# Patient Record
Sex: Male | Born: 1995 | Race: White | Hispanic: No | Marital: Single | Smoking: Never smoker
Health system: Southern US, Community
[De-identification: ages and names within clinical notes are randomized; demographics above are authoritative.]

## PROBLEM LIST (undated history)

## (undated) DIAGNOSIS — R519 Headache, unspecified: Secondary | ICD-10-CM

## (undated) DIAGNOSIS — R51 Headache: Secondary | ICD-10-CM

## (undated) DIAGNOSIS — J45909 Unspecified asthma, uncomplicated: Secondary | ICD-10-CM

## (undated) DIAGNOSIS — Z8489 Family history of other specified conditions: Secondary | ICD-10-CM

## (undated) DIAGNOSIS — J309 Allergic rhinitis, unspecified: Secondary | ICD-10-CM

## (undated) HISTORY — PX: OTHER SURGICAL HISTORY: SHX169

## (undated) HISTORY — DX: Allergic rhinitis, unspecified: J30.9

---
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
## 2017-06-14 ENCOUNTER — Other Ambulatory Visit: Payer: Self-pay | Admitting: Cardiothoracic Surgery

## 2017-06-14 DIAGNOSIS — Q676 Pectus excavatum: Secondary | ICD-10-CM

## 2017-06-15 ENCOUNTER — Encounter: Payer: Self-pay | Admitting: Cardiothoracic Surgery

## 2017-06-15 ENCOUNTER — Ambulatory Visit
Admission: RE | Admit: 2017-06-15 | Discharge: 2017-06-15 | Disposition: A | Discharge status: Home / Self Care | Payer: 59 | Source: Ambulatory Visit | Attending: Cardiothoracic Surgery | Admitting: Cardiothoracic Surgery

## 2017-06-15 ENCOUNTER — Ambulatory Visit: Payer: 59 | Admitting: Cardiothoracic Surgery

## 2017-06-15 VITALS — BP 110/74 | HR 52 | Resp 18 | Ht 71.0 in | Wt 117.0 lb

## 2017-06-15 DIAGNOSIS — Q676 Pectus excavatum: Secondary | ICD-10-CM

## 2017-06-15 NOTE — Progress Notes (Signed)
[address]       [place] [id number]             [phone]      Jeff MiresJoshua M Keller [facility] Medical Record #[id number] Date of Birth: 01-25-1996  Referring: Kendra Opitzole, Dawn Watkins, NP Primary Care: Kendra Opitzole, Dawn Watkins, NP  Chief Complaint:   POST OP FOLLOW UP 11/25/2015  OPERATIVE REPORT PREOPERATIVE DIAGNOSIS:  Pectus excavatum. POSTOPERATIVE DIAGNOSIS:  Pectus excavatum. PROCEDURE:  modified Ravitch repair of pectus excavatum with sternal plating. SURGEON:  Sheliah PlaneEdward Sura Canul, MD. FIRST ASSISTANT:  Armanda Magicaul Villarreal, [place].  History of Present Illness:     Patient comes in today in follow-up after his pectus repair done approximately 18 months ago.  Patient has no specific complaints denies any pain in his chest.     Past Medical History:  Diagnosis Date  . Allergic rhinitis   . Asthma    as a child  . Family history of adverse reaction to anesthesia    Moather - extreme nausea  . Headache    migraines- years ago- very rare now     Social History   Tobacco Use  Smoking Status Never Smoker  Smokeless Tobacco Never Used    Social History   Substance and Sexual Activity  Alcohol Use No  . Alcohol/week: 0.0 oz     No Known Allergies  Current Outpatient Medications  Medication Sig Dispense Refill  . ibuprofen (ADVIL,MOTRIN) 200 MG tablet Take 200 mg by mouth every 6 (six) hours as needed.     No current facility-administered medications for this visit.        Physical Exam: BP 110/74   Pulse (!) 52   Resp 18   Ht 5\' 11"  (1.803 m)   Wt 117 lb (53.1 kg)   SpO2 99% Comment: RA  BMI 16.32 kg/m   General appearance: alert, cooperative, appears stated age and no distress Head: Normocephalic, without obvious abnormality, atraumatic Neck: no adenopathy, no carotid bruit, no JVD, supple, symmetrical, trachea midline and thyroid not enlarged, symmetric, no tenderness/mass/nodules Lymph nodes: Cervical, supraclavicular, and axillary nodes  normal. Resp: clear to auscultation bilaterally Back: symmetric, no curvature. ROM normal. No CVA tenderness. Cardio: regular rate and rhythm, S1, S2 normal, no murmur, click, rub or gallop GI: soft, non-tender; bowel sounds normal; no masses,  no organomegaly Extremities: extremities normal, atraumatic, no cyanosis or edema and Homans sign is negative, no sign of DVT Neurologic: Grossly normal On examination of the incision pectus the patient has had recurrence of the pectus, on my view appears slightly worsened in 6 months ago though the patient does not think so.   Diagnostic Studies & Laboratory data:     Recent Radiology Findings:   Dg Chest 2 View  Result Date: 06/15/2017 CLINICAL DATA:  Pectus excavatum follow-up. EXAM: CHEST  2 VIEW COMPARISON:  Chest x-ray 12/15/2016 FINDINGS: The cardiac silhouette, mediastinal and hilar contours are normal and stable. The lungs are clear. Stable plate and screws on the sternum from a pectus repair. Stable moderate pectus deformity. IMPRESSION: No acute cardiopulmonary findings. Stable fixation hardware on the sternum. Electronically Signed   By: Rudie MeyerP.  Gallerani M.D.   On: 06/15/2017 11:49   I have independently reviewed the above radiology studies  and reviewed the findings with the patient.   Recent Lab Findings: Lab Results  Component Value Date   WBC 9.9 11/27/2015   HGB 12.2 (L) 11/27/2015   HCT 38.3 (L) 11/27/2015  PLT 123 (L) 11/27/2015   GLUCOSE 103 (H) 11/27/2015   ALT 11 (L) 11/27/2015   AST 20 11/27/2015   NA 137 11/27/2015   K 3.9 11/27/2015   CL 104 11/27/2015   CREATININE 0.95 11/27/2015   BUN <5 (L) 11/27/2015   CO2 27 11/27/2015   INR 1.20 11/23/2015      Assessment / Plan:   Sternal fixation is unchanged radiographically the patient has had some degree of recurrence of the pectus.  At this point he is not interested in any revision repair but is willing to continue to be followed, we will plan to see him back in 8  months with a follow-up chest x-ray.    Delight Ovens MD      [address] [place] [place] [address] Office [phone]   Beeper [phone]  06/15/2017 12:26 PM

## 2018-02-21 ENCOUNTER — Other Ambulatory Visit: Payer: Self-pay | Admitting: Cardiothoracic Surgery

## 2018-02-21 DIAGNOSIS — Q676 Pectus excavatum: Secondary | ICD-10-CM

## 2018-02-22 ENCOUNTER — Ambulatory Visit: Payer: 59 | Admitting: Cardiothoracic Surgery

## 2018-02-22 ENCOUNTER — Encounter: Payer: Self-pay | Admitting: Cardiothoracic Surgery

## 2018-02-22 ENCOUNTER — Ambulatory Visit
Admission: RE | Admit: 2018-02-22 | Discharge: 2018-02-22 | Disposition: A | Discharge status: Home / Self Care | Payer: 59 | Source: Ambulatory Visit | Attending: Cardiothoracic Surgery | Admitting: Cardiothoracic Surgery

## 2018-02-22 ENCOUNTER — Other Ambulatory Visit: Payer: Self-pay

## 2018-02-22 VITALS — BP 116/74 | HR 96 | Resp 18 | Ht 71.0 in | Wt 122.0 lb

## 2018-02-22 DIAGNOSIS — Q676 Pectus excavatum: Secondary | ICD-10-CM | POA: Diagnosis not present

## 2018-02-22 NOTE — Progress Notes (Signed)
[address]       [place] [address]             [phone]      KASTIN CERDA [facility] Medical Record #[id number] Date of Birth: [birth year]  Referring: Kendra Opitz, NP Primary Care: Kendra Opitz, NP  Chief Complaint:   POST OP FOLLOW UP 11/25/2015  OPERATIVE REPORT PREOPERATIVE DIAGNOSIS:  Pectus excavatum. POSTOPERATIVE DIAGNOSIS:  Pectus excavatum. PROCEDURE:  modified Ravitch repair of pectus excavatum with sternal plating. SURGEON:  Sheliah Plane, MD. FIRST ASSISTANT:  Armanda Magic, [place].  History of Present Illness:     Patient comes in today in follow-up after his pectus repair done approximately 24 months ago.  Patient has no specific complaints denies any pain in his chest.     Past Medical History:  Diagnosis Date  . Allergic rhinitis   . Asthma    as a child  . Family history of adverse reaction to anesthesia    Moather - extreme nausea  . Headache    migraines- years ago- very rare now     Social History   Tobacco Use  Smoking Status Never Smoker  Smokeless Tobacco Never Used    Social History   Substance and Sexual Activity  Alcohol Use No  . Alcohol/week: 0.0 standard drinks     No Known Allergies  Current Outpatient Medications  Medication Sig Dispense Refill  . ibuprofen (ADVIL,MOTRIN) 200 MG tablet Take 200 mg by mouth every 6 (six) hours as needed.     No current facility-administered medications for this visit.        Physical Exam: BP 116/74 (BP Location: Right Arm, Patient Position: Sitting, Cuff Size: Normal)   Pulse 96   Resp 18   Ht 5\' 11"  (1.803 m)   Wt 122 lb (55.3 kg)   SpO2 100% Comment: RA  BMI 17.02 kg/m  General appearance: alert, cooperative and no distress Head: Normocephalic, without obvious abnormality, atraumatic Neck: no adenopathy, no carotid bruit, no JVD, supple, symmetrical, trachea midline and thyroid not enlarged, symmetric, no  tenderness/mass/nodules Lymph nodes: Cervical, supraclavicular, and axillary nodes normal. Resp: clear to auscultation bilaterally Back: symmetric, no curvature. ROM normal. No CVA tenderness. Cardio: regular rate and rhythm, S1, S2 normal, no murmur, click, rub or gallop GI: soft, non-tender; bowel sounds normal; no masses,  no organomegaly Extremities: extremities normal, atraumatic, no cyanosis or edema and Homans sign is negative, no sign of DVT Neurologic: Grossly normal Patient has had regression of his repair, little change from when seen 8 months ago but definitely different from his initial six-month and one-year follow-up.   Diagnostic Studies & Laboratory data:     Recent Radiology Findings:   Dg Chest 2 View  Result Date: 02/22/2018 CLINICAL DATA:  Pectus excavatum. EXAM: CHEST - 2 VIEW COMPARISON:  06/15/2017. FINDINGS: Mediastinum hilar structures normal. Heart size normal. Lungs are clear. No pleural effusion or pneumothorax. Prior median sternotomy. Hardware intact and unchanged. Stable pectus deformity. No acute bony abnormality. IMPRESSION: 1. Stable prior median sternotomy. Hardware intact and in stable position. Stable pectus carinatum. 2.  No acute bony abnormality identified. Electronically Signed   By: Maisie Fus  Register   On: 02/22/2018 09:55   I have independently reviewed the above radiology studies  and reviewed the findings with the patient.   Recent Lab Findings: Lab Results  Component Value Date   WBC 9.9 11/27/2015   HGB 12.2 (L) 11/27/2015  HCT 38.3 (L) 11/27/2015   PLT 123 (L) 11/27/2015   GLUCOSE 103 (H) 11/27/2015   ALT 11 (L) 11/27/2015   AST 20 11/27/2015   NA 137 11/27/2015   K 3.9 11/27/2015   CL 104 11/27/2015   CREATININE 0.95 11/27/2015   BUN <5 (L) 11/27/2015   CO2 27 11/27/2015   INR 1.20 11/23/2015      Assessment / Plan:   Patient's hardware appears intact on x-ray, he has had progression of his repair with recurrent pectus.  I  discussed this with him at this point he is not interested in further repair but will return in 8 months for follow-up visit.   Delight Ovens MD      [address] [place] [place] [address] Office [phone]   Beeper [phone]  02/22/2018 10:14 AM

## 2018-03-23 IMAGING — CR DG CHEST 2V
2 series · 2 of 2 positions shown · non-contrast
Comparison: Radiographs 09/14/2015.  CT 10/08/2015.

CLINICAL DATA: Pre-op for pectus excavatum surgery. No current
complaints.

EXAM:
CHEST  2 VIEW

[w chest pa]
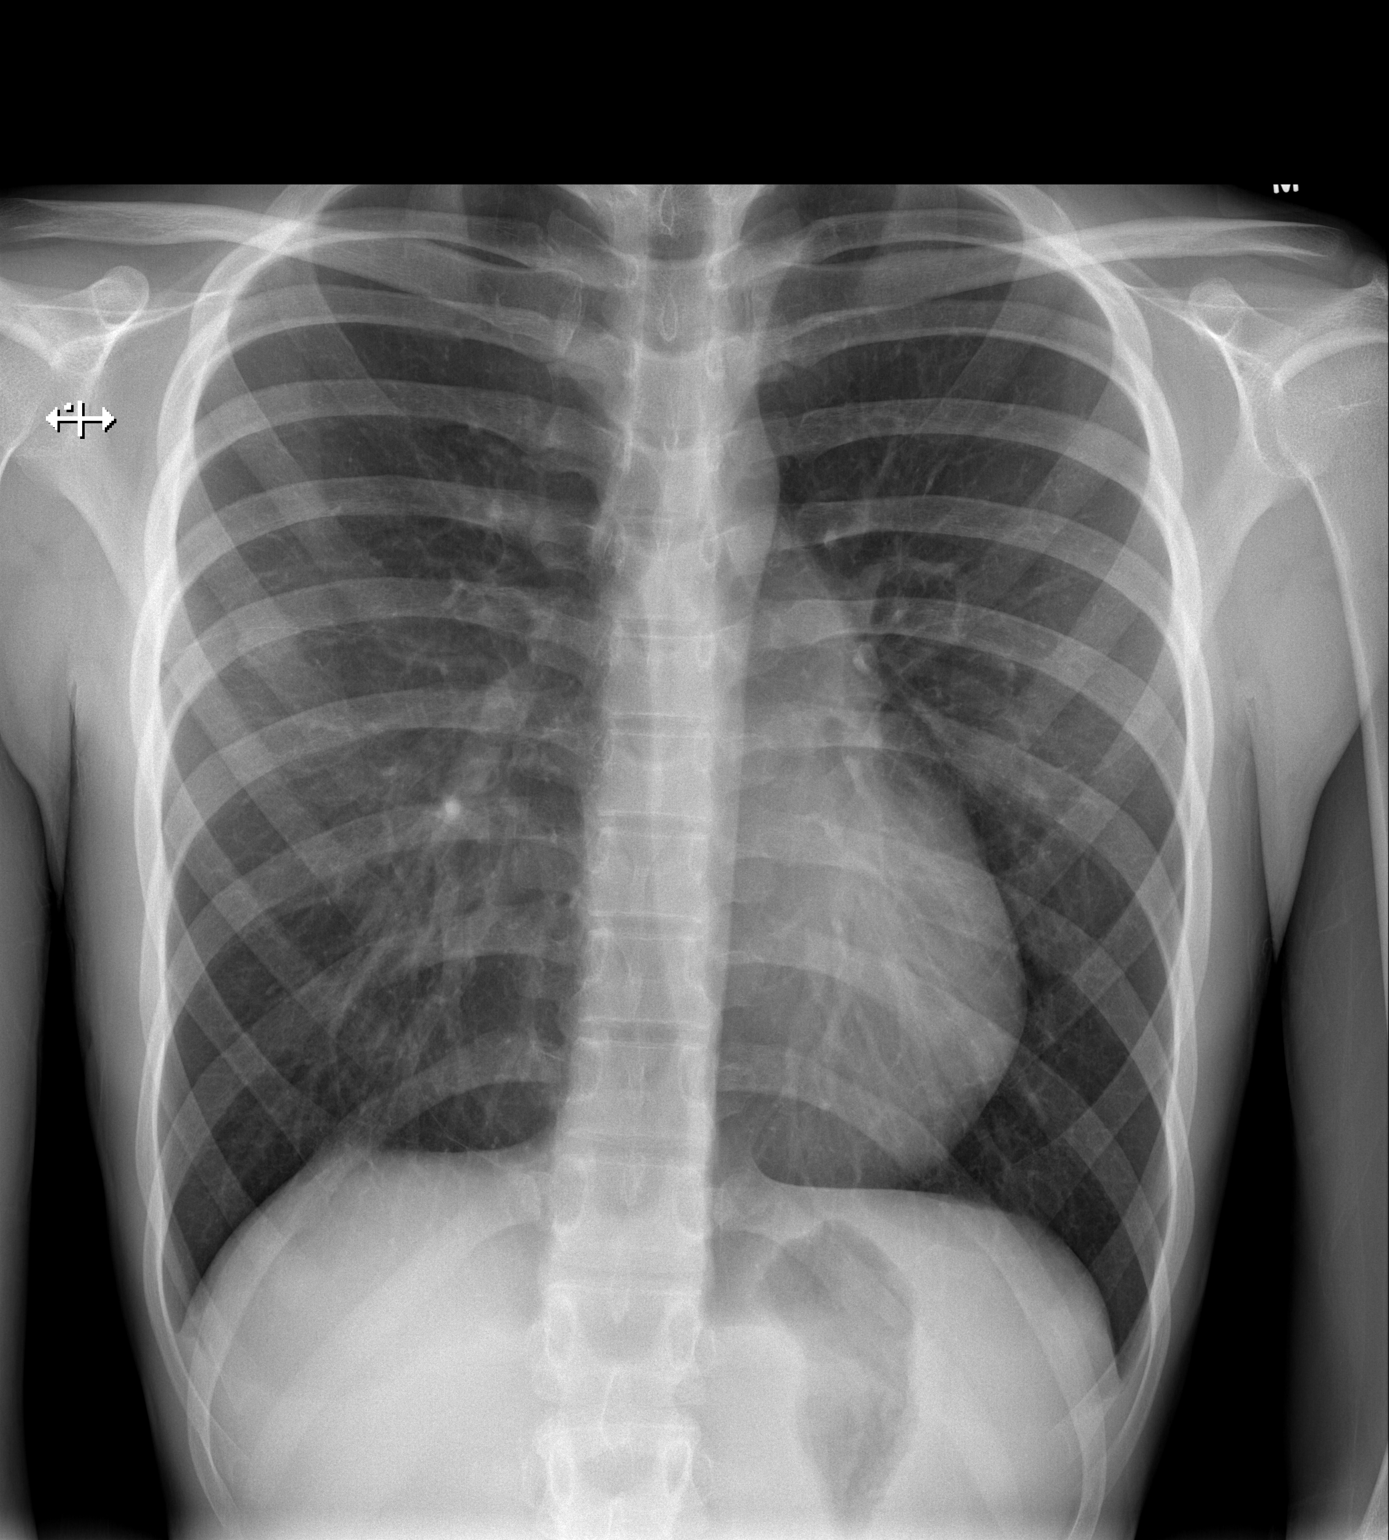

[w chest lat]
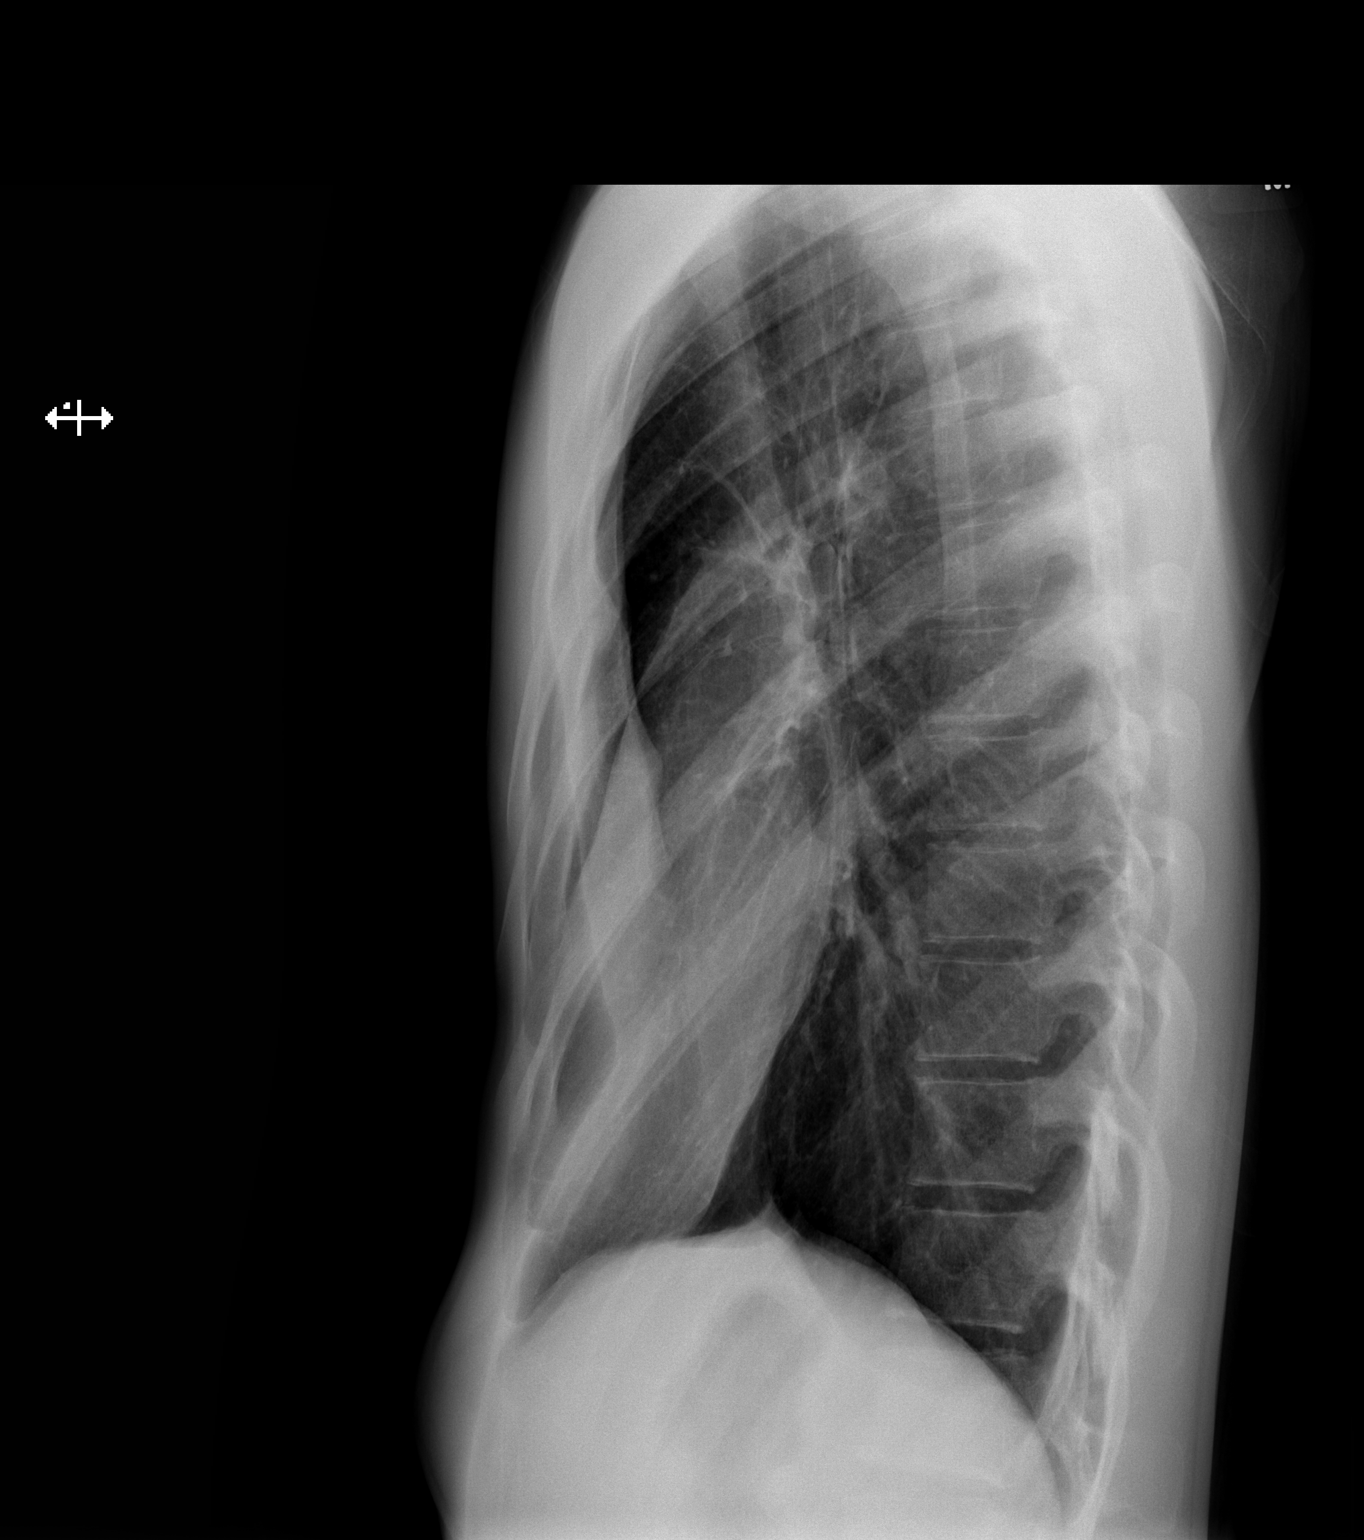

[2 of 2 positions shown; findings below may reference images not displayed]

FINDINGS: The heart size and mediastinal contours are stable. The lungs are
clear. There is no pleural effusion or pneumothorax. Marked pectus
excavatum deformity appears unchanged. Healing fracture of the right
fourth rib anteriorly is again noted. No acute osseous findings are
seen.
IMPRESSION: Stable chest with chronic pectus deformity.  No acute findings.

## 2018-03-25 IMAGING — CR DG CHEST 1V PORT
1 series · 1 of 1 positions shown · non-contrast
Comparison: 11/23/2015

CLINICAL DATA: Pectus excavatum repair

EXAM:
PORTABLE CHEST 1 VIEW

[AP]
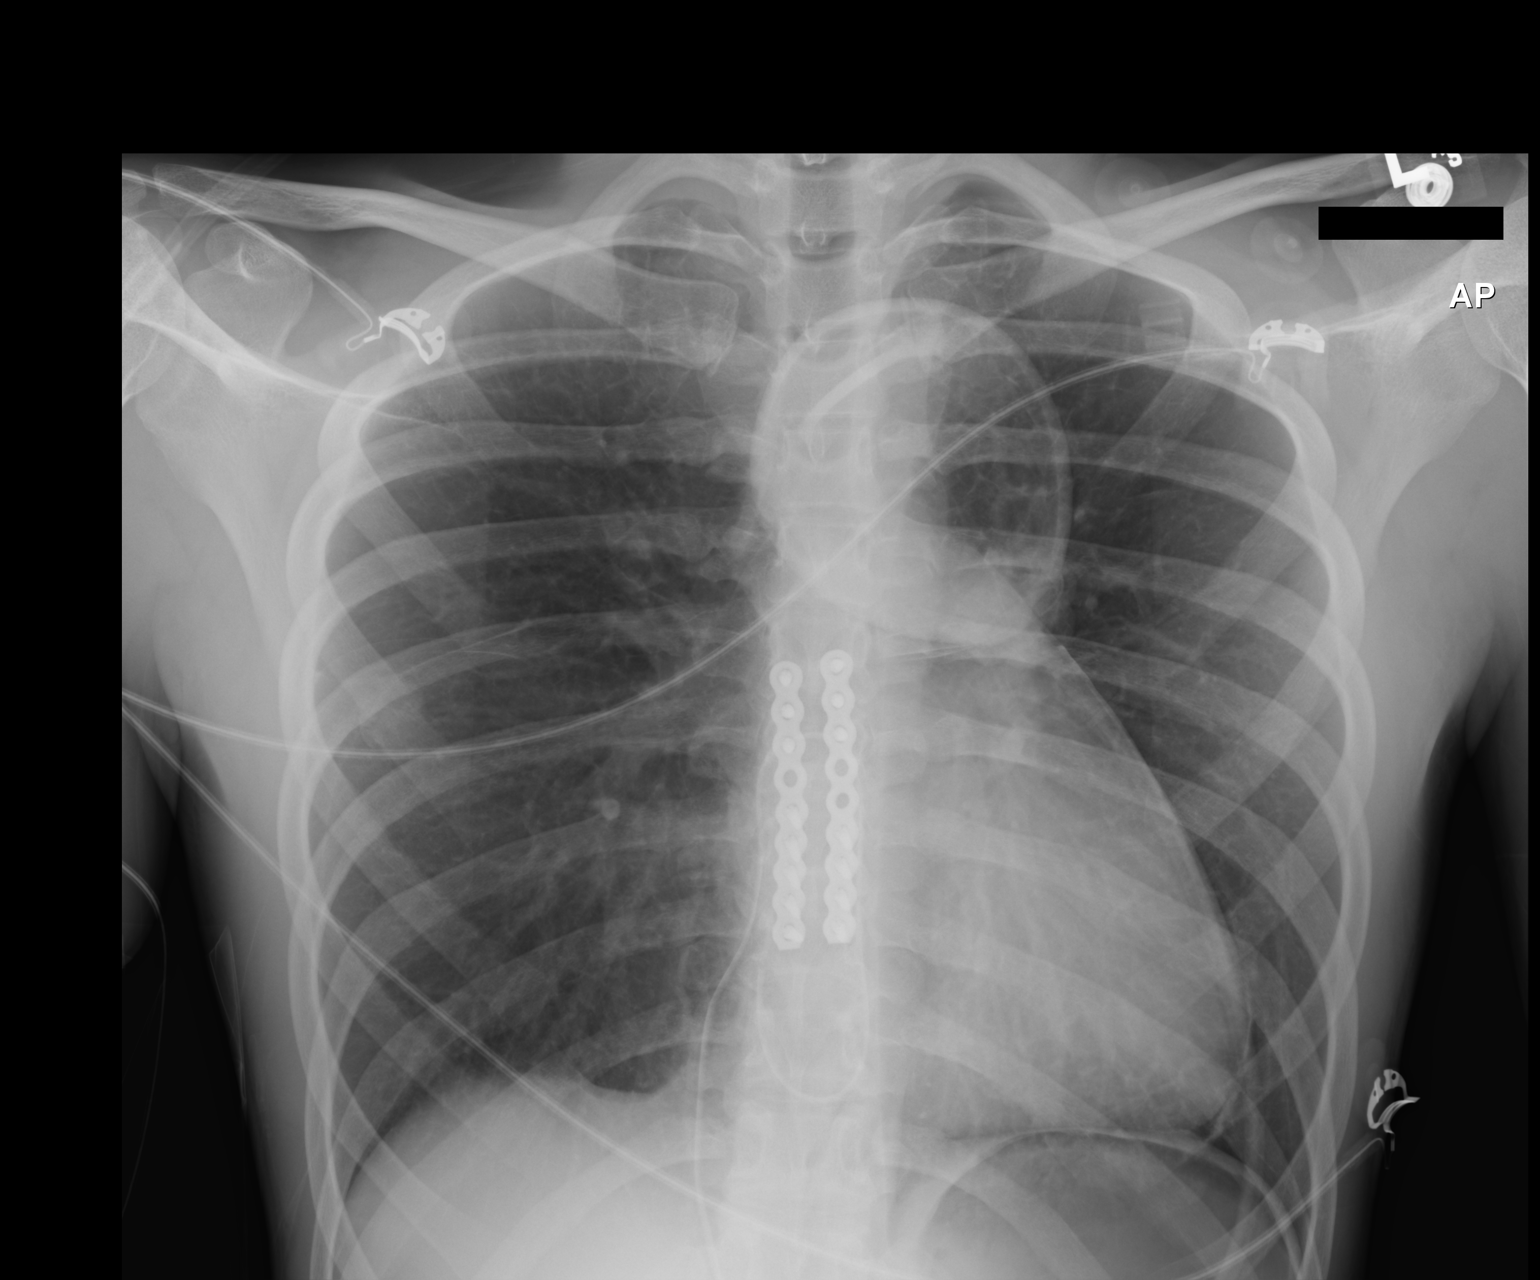

[1 of 1 positions shown; findings below may reference images not displayed]

FINDINGS: There is no focal parenchymal opacity. There is no pleural effusion
or pneumothorax. The heart and mediastinal contours are
unremarkable.

Interval pectus excavatum repair with 2 metallic sternal plates
present.
IMPRESSION: Interval pectus excavatum repair with 2 metallic sternal plates
present.

## 2018-03-27 IMAGING — DX DG CHEST 2V
2 series · 2 of 2 positions shown · non-contrast
Comparison: 11/26/2015

CLINICAL DATA: Sternal repair, postop day 2, chest soreness

EXAM:
CHEST  2 VIEW

[w chest pa]
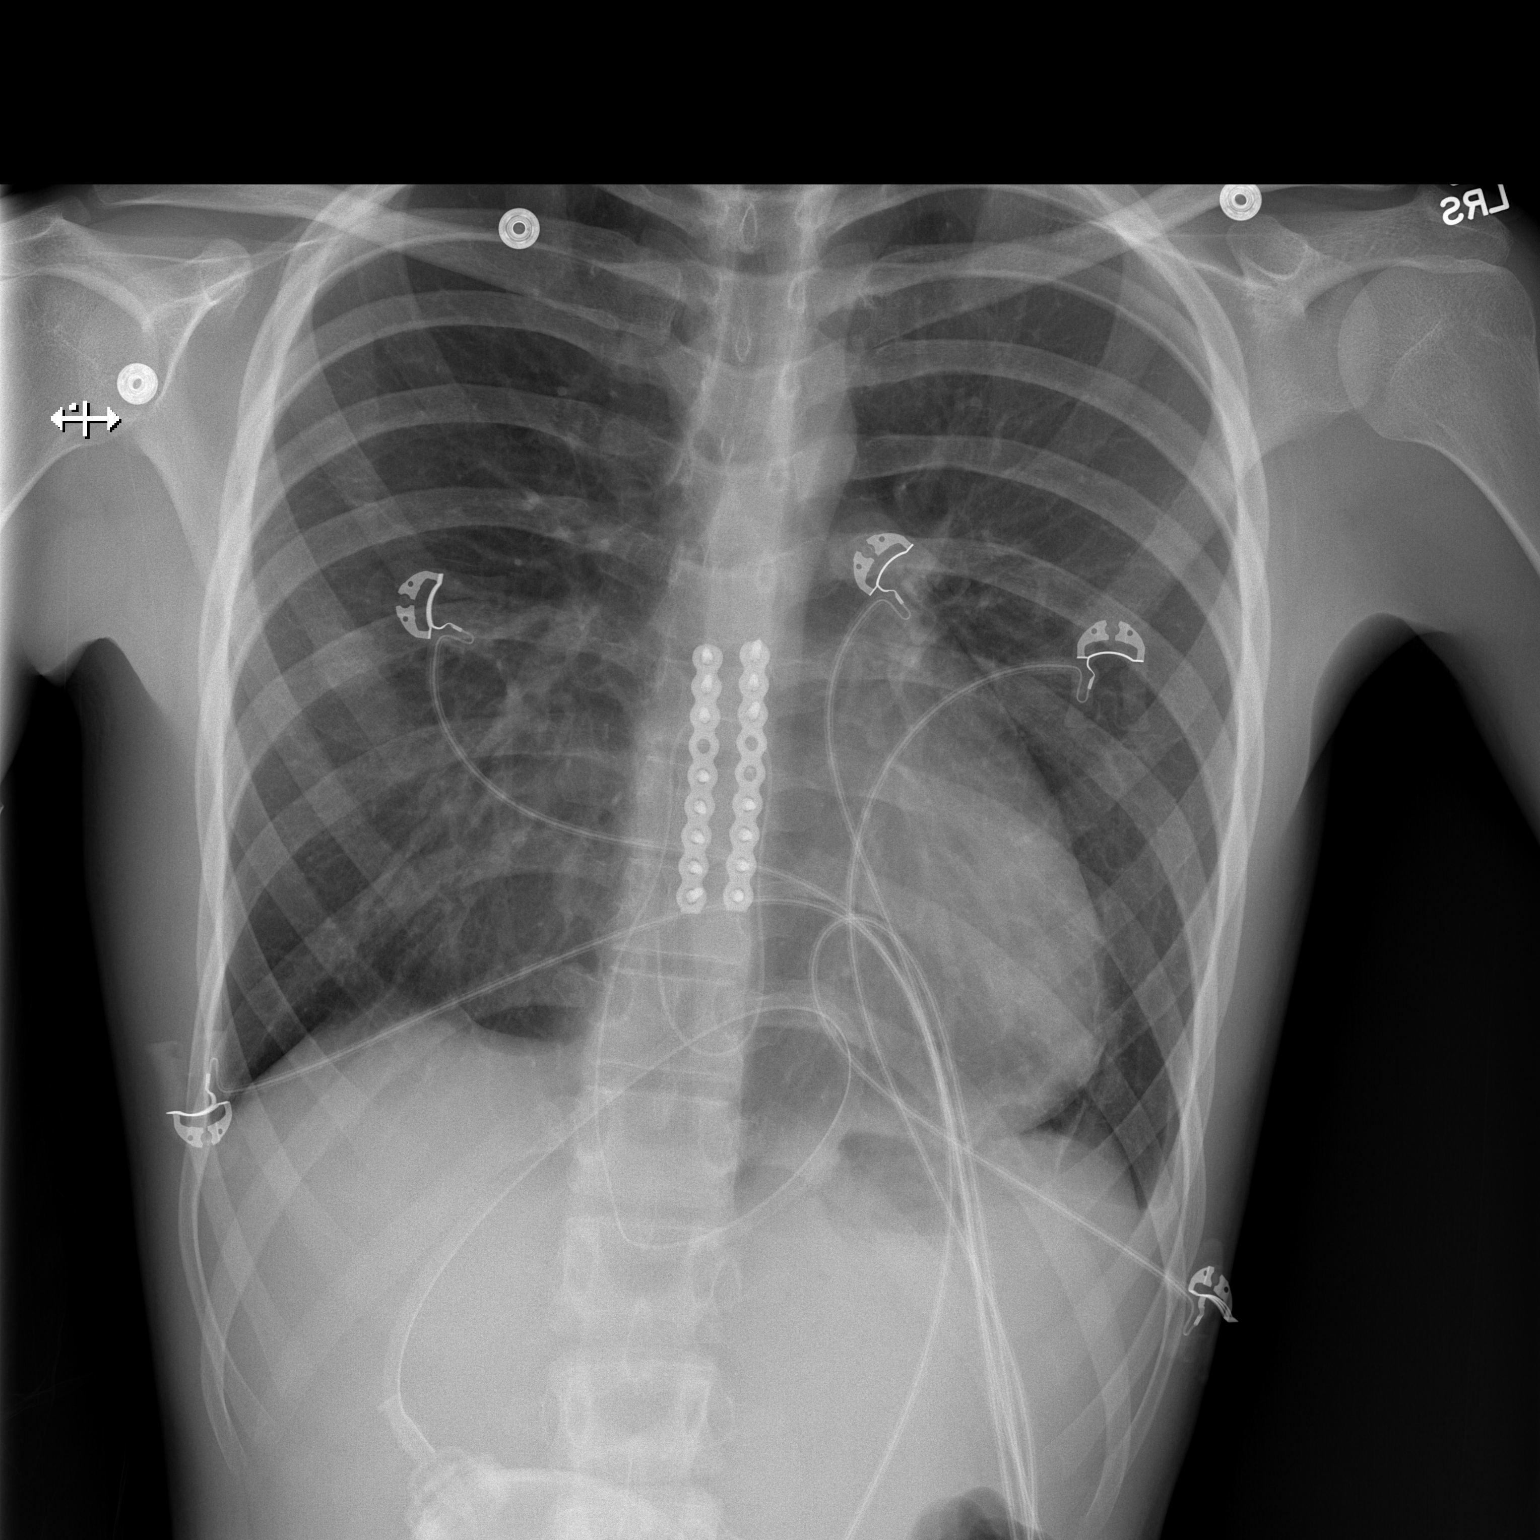

[w chest lat]
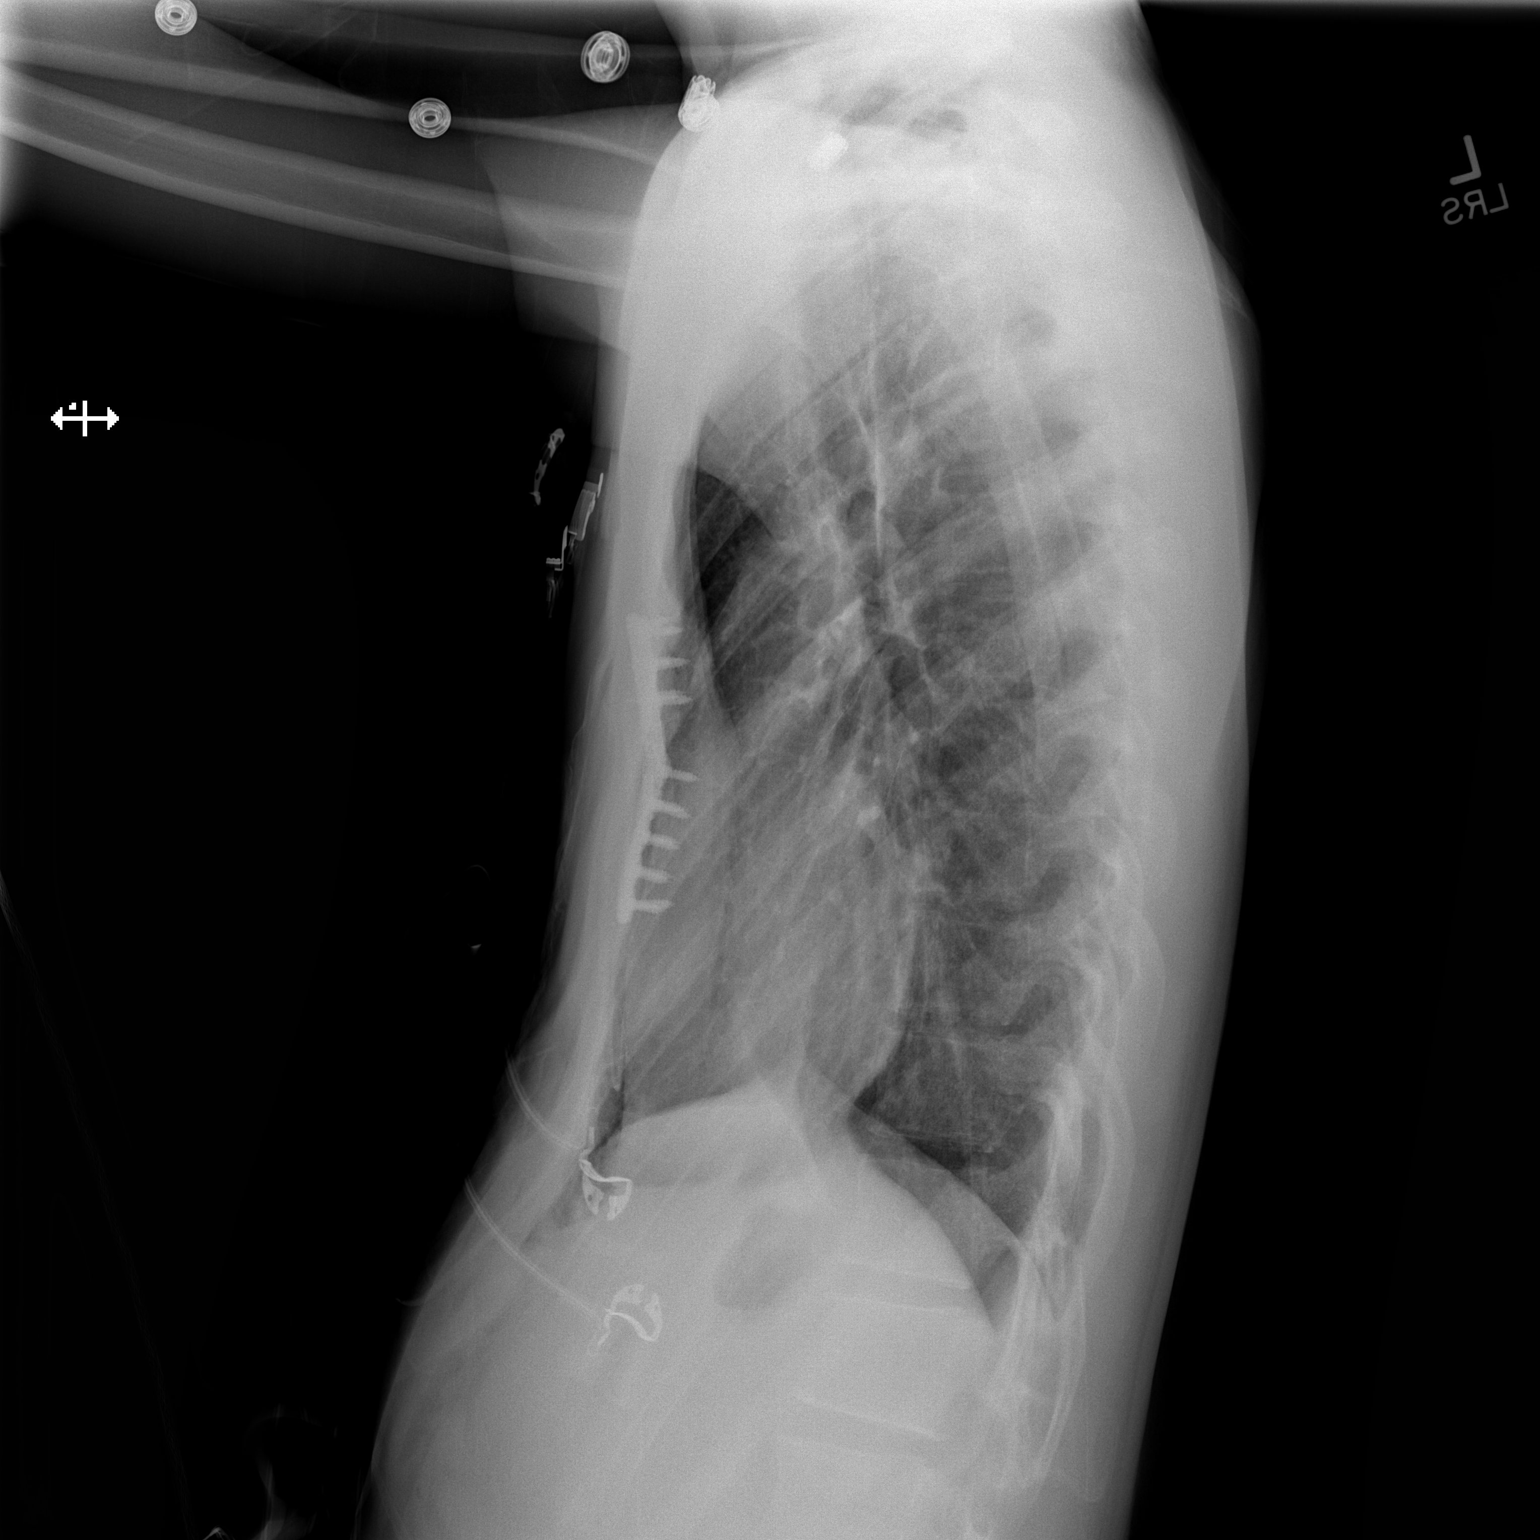

[2 of 2 positions shown; findings below may reference images not displayed]

FINDINGS: Lungs are clear.  No pleural effusion or pneumothorax.

The heart is normal in size.

Postsurgical changes involving the sternum with 2 sternal plates and
multiple screws. On the lateral view, the sternal plates are angled
157 degrees, presumably related to the patient's known pectus
deformity.

Surgical drain.
IMPRESSION: Postsurgical changes involving the sternum, with angled appearance
of the sternal plates on the lateral view, as described above.

No evidence of acute cardiopulmonary disease.

## 2018-10-25 ENCOUNTER — Ambulatory Visit: Payer: 59 | Admitting: Cardiothoracic Surgery

## 2018-12-06 ENCOUNTER — Ambulatory Visit: Payer: 59 | Admitting: Cardiothoracic Surgery

## 2019-01-10 ENCOUNTER — Ambulatory Visit: Payer: 59 | Admitting: Cardiothoracic Surgery

## 2019-02-07 ENCOUNTER — Ambulatory Visit: Payer: 59 | Admitting: Cardiothoracic Surgery

## 2020-07-14 ENCOUNTER — Other Ambulatory Visit: Payer: Self-pay | Admitting: Cardiothoracic Surgery

## 2020-07-14 DIAGNOSIS — Q676 Pectus excavatum: Secondary | ICD-10-CM

## 2020-07-16 ENCOUNTER — Encounter: Payer: Self-pay | Admitting: Cardiothoracic Surgery

## 2020-07-16 ENCOUNTER — Ambulatory Visit: Payer: BC Managed Care – PPO | Admitting: Cardiothoracic Surgery

## 2020-07-16 ENCOUNTER — Other Ambulatory Visit: Payer: Self-pay

## 2020-07-16 ENCOUNTER — Ambulatory Visit
Admission: RE | Admit: 2020-07-16 | Discharge: 2020-07-16 | Disposition: A | Discharge status: Home / Self Care | Payer: BC Managed Care – PPO | Source: Ambulatory Visit | Attending: Cardiothoracic Surgery | Admitting: Cardiothoracic Surgery

## 2020-07-16 VITALS — BP 119/80 | HR 80 | Resp 20 | Ht 71.0 in | Wt 120.0 lb

## 2020-07-16 DIAGNOSIS — Q676 Pectus excavatum: Secondary | ICD-10-CM

## 2020-07-16 DIAGNOSIS — R0602 Shortness of breath: Secondary | ICD-10-CM | POA: Diagnosis not present

## 2020-07-16 NOTE — Progress Notes (Signed)
[address]       [place] [address]             [phone]      Jeff Keller [facility] Medical Record #[id number] Date of Birth: [birth year]  Referring: Kendra Opitz, NP Primary Care: Kendra Opitz, NP  Chief Complaint:   POST OP FOLLOW UP 11/25/2015  OPERATIVE REPORT PREOPERATIVE DIAGNOSIS:  Pectus excavatum. POSTOPERATIVE DIAGNOSIS:  Pectus excavatum. PROCEDURE:  modified Ravitch repair of pectus excavatum with sternal plating. SURGEON:  Sheliah Plane, MD. FIRST ASSISTANT:  Armanda Magic, [place].  History of Present Illness:     Patient returns to the office with his father for follow-up evaluation.  He originally had a pectus repair done July 2017.  At his initial 35-month and 1 year follow-up the repair appeared stable.  At 2 years he had evidence of regression of his repair.  He missed an appointment in 2021 presumably due to Covid concerns.  He now returns because of increasing symptoms and continued regression of his repair.  He notes over the past 6 months he has has noted episodes of increasing shortness of breath with exertion/       Past Medical History:  Diagnosis Date  . Allergic rhinitis   . Asthma    as a child  . Family history of adverse reaction to anesthesia    Moather - extreme nausea  . Headache    migraines- years ago- very rare now     Social History   Tobacco Use  Smoking Status Never Smoker  Smokeless Tobacco Never Used    Social History   Substance and Sexual Activity  Alcohol Use No  . Alcohol/week: 0.0 standard drinks     No Known Allergies  Current Outpatient Medications  Medication Sig Dispense Refill  . ibuprofen (ADVIL,MOTRIN) 200 MG tablet Take 200 mg by mouth every 6 (six) hours as needed.     No current facility-administered medications for this visit.       Physical Exam: BP 119/80   Pulse 80   Resp 20   Ht 5\' 11"  (1.803 m)   Wt 120 lb (54.4 kg)   SpO2 100% Comment: RA   BMI 16.74 kg/m  General appearance: alert, cooperative, appears stated age and no distress Head: Normocephalic, without obvious abnormality, atraumatic Neck: no adenopathy, no carotid bruit, no JVD, supple, symmetrical, trachea midline and thyroid not enlarged, symmetric, no tenderness/mass/nodules Lymph nodes: Cervical, supraclavicular, and axillary nodes normal. Resp: clear to auscultation bilaterally Back: symmetric, no curvature. ROM normal. No CVA tenderness. Cardio: regular rate and rhythm, S1, S2 normal, no murmur, click, rub or gallop GI: soft, non-tender; bowel sounds normal; no masses,  no organomegaly Extremities: extremities normal, atraumatic, no cyanosis or edema Neurologic: Grossly normal  On examination of the chest-his incisions are intact and well-healed but there is been almost complete regression to his preoperative state since he was last seen -over the past 2 years    Diagnostic Studies & Laboratory data:     Recent Radiology Findings:   DG Chest 2 View  Result Date: 07/16/2020 CLINICAL DATA:  Shortness of breath.  Prior pectus excavatum repair. EXAM: CHEST - 2 VIEW COMPARISON:  Chest x-ray dated February 22, 2018. FINDINGS: The heart size and mediastinal contours are within normal limits. Normal pulmonary vascularity. No focal consolidation, pleural effusion, or pneumothorax. No acute osseous abnormality. Mild residual pectus excavatum deformity status post sternal plating. IMPRESSION: No active  cardiopulmonary disease. Electronically Signed   By: Obie Dredge M.D.   On: 07/16/2020 13:44    I have independently reviewed the above radiology studies  and reviewed the findings with the patient.   Recent Lab Findings: Lab Results  Component Value Date   WBC 9.9 11/27/2015   HGB 12.2 (L) 11/27/2015   HCT 38.3 (L) 11/27/2015   PLT 123 (L) 11/27/2015   GLUCOSE 103 (H) 11/27/2015   ALT 11 (L) 11/27/2015   AST 20 11/27/2015   NA 137 11/27/2015   K 3.9  11/27/2015   CL 104 11/27/2015   CREATININE 0.95 11/27/2015   BUN <5 (L) 11/27/2015   CO2 27 11/27/2015   INR 1.20 11/23/2015      Assessment / Plan:   Patient's hardware appears intact on x-ray, he has had progression of his repair with recurrent pectus.  Patient comes to the office today with his father.  On exam it is obvious that he has had almost complete regression of his previous pectus repair.  In addition he is having more shortness of breath with exertion.  I explained to he and his father that in the coming 2 weeks that I was retiring from [facility].  The other thoracic surgeons at [facility] are not interested in pectus repair.  I have suggested he be seen by Dr. Daisey Must at [facility].  They are agreeable with this.  We will make referral arrangements with his office.   Delight Ovens MD      [address] [place] [place] [address] Office [phone]   Beeper [phone]  07/16/2020 2:57 PM

## 2020-07-23 ENCOUNTER — Telehealth: Payer: BC Managed Care – PPO | Admitting: Cardiothoracic Surgery

## 2020-09-01 DIAGNOSIS — Z1331 Encounter for screening for depression: Secondary | ICD-10-CM | POA: Diagnosis not present

## 2020-09-01 DIAGNOSIS — Z681 Body mass index (BMI) 19 or less, adult: Secondary | ICD-10-CM | POA: Diagnosis not present

## 2020-09-01 DIAGNOSIS — Z0000 Encounter for general adult medical examination without abnormal findings: Secondary | ICD-10-CM | POA: Diagnosis not present

## 2020-09-08 DIAGNOSIS — F419 Anxiety disorder, unspecified: Secondary | ICD-10-CM | POA: Diagnosis not present

## 2020-09-08 DIAGNOSIS — R7989 Other specified abnormal findings of blood chemistry: Secondary | ICD-10-CM | POA: Diagnosis not present

## 2020-09-09 DIAGNOSIS — Q676 Pectus excavatum: Secondary | ICD-10-CM | POA: Diagnosis not present

## 2020-09-09 DIAGNOSIS — Z01818 Encounter for other preprocedural examination: Secondary | ICD-10-CM | POA: Diagnosis not present

## 2020-09-09 DIAGNOSIS — Z789 Other specified health status: Secondary | ICD-10-CM | POA: Diagnosis not present

## 2020-10-13 DIAGNOSIS — Z681 Body mass index (BMI) 19 or less, adult: Secondary | ICD-10-CM | POA: Diagnosis not present

## 2020-10-13 DIAGNOSIS — F419 Anxiety disorder, unspecified: Secondary | ICD-10-CM | POA: Diagnosis not present

## 2020-11-04 DIAGNOSIS — Z01818 Encounter for other preprocedural examination: Secondary | ICD-10-CM | POA: Diagnosis not present

## 2020-11-04 DIAGNOSIS — Q676 Pectus excavatum: Secondary | ICD-10-CM | POA: Diagnosis not present

## 2020-11-05 DIAGNOSIS — Q676 Pectus excavatum: Secondary | ICD-10-CM | POA: Diagnosis not present

## 2020-11-05 DIAGNOSIS — Z01812 Encounter for preprocedural laboratory examination: Secondary | ICD-10-CM | POA: Diagnosis not present

## 2020-11-05 DIAGNOSIS — Z20822 Contact with and (suspected) exposure to covid-19: Secondary | ICD-10-CM | POA: Diagnosis not present

## 2020-11-18 DIAGNOSIS — F419 Anxiety disorder, unspecified: Secondary | ICD-10-CM | POA: Diagnosis not present

## 2020-11-18 DIAGNOSIS — Z4682 Encounter for fitting and adjustment of non-vascular catheter: Secondary | ICD-10-CM | POA: Diagnosis not present

## 2020-11-18 DIAGNOSIS — Q676 Pectus excavatum: Secondary | ICD-10-CM | POA: Diagnosis not present

## 2020-11-18 DIAGNOSIS — I959 Hypotension, unspecified: Secondary | ICD-10-CM | POA: Diagnosis not present

## 2020-11-18 DIAGNOSIS — R001 Bradycardia, unspecified: Secondary | ICD-10-CM | POA: Diagnosis not present

## 2020-11-18 DIAGNOSIS — J9383 Other pneumothorax: Secondary | ICD-10-CM | POA: Diagnosis not present

## 2020-11-18 DIAGNOSIS — J939 Pneumothorax, unspecified: Secondary | ICD-10-CM | POA: Diagnosis not present

## 2020-11-18 DIAGNOSIS — Z79899 Other long term (current) drug therapy: Secondary | ICD-10-CM | POA: Diagnosis not present

## 2020-11-18 DIAGNOSIS — G8918 Other acute postprocedural pain: Secondary | ICD-10-CM | POA: Diagnosis not present

## 2020-11-18 DIAGNOSIS — R54 Age-related physical debility: Secondary | ICD-10-CM | POA: Diagnosis not present

## 2020-11-18 DIAGNOSIS — R0602 Shortness of breath: Secondary | ICD-10-CM | POA: Diagnosis not present

## 2020-11-18 DIAGNOSIS — Z20822 Contact with and (suspected) exposure to covid-19: Secondary | ICD-10-CM | POA: Diagnosis not present

## 2020-11-18 DIAGNOSIS — J948 Other specified pleural conditions: Secondary | ICD-10-CM | POA: Diagnosis not present

## 2020-11-18 DIAGNOSIS — R000 Tachycardia, unspecified: Secondary | ICD-10-CM | POA: Diagnosis not present

## 2020-12-01 DIAGNOSIS — F419 Anxiety disorder, unspecified: Secondary | ICD-10-CM | POA: Diagnosis not present

## 2020-12-02 DIAGNOSIS — Z681 Body mass index (BMI) 19 or less, adult: Secondary | ICD-10-CM | POA: Diagnosis not present

## 2020-12-02 DIAGNOSIS — Q676 Pectus excavatum: Secondary | ICD-10-CM | POA: Diagnosis not present

## 2020-12-02 DIAGNOSIS — M25511 Pain in right shoulder: Secondary | ICD-10-CM | POA: Diagnosis not present

## 2020-12-02 DIAGNOSIS — Z9889 Other specified postprocedural states: Secondary | ICD-10-CM | POA: Diagnosis not present

## 2020-12-02 DIAGNOSIS — R0781 Pleurodynia: Secondary | ICD-10-CM | POA: Diagnosis not present

## 2021-03-03 DIAGNOSIS — Z23 Encounter for immunization: Secondary | ICD-10-CM | POA: Diagnosis not present

## 2021-03-03 DIAGNOSIS — F419 Anxiety disorder, unspecified: Secondary | ICD-10-CM | POA: Diagnosis not present

## 2021-03-24 DIAGNOSIS — Z01818 Encounter for other preprocedural examination: Secondary | ICD-10-CM | POA: Diagnosis not present

## 2021-03-24 DIAGNOSIS — Z862 Personal history of diseases of the blood and blood-forming organs and certain disorders involving the immune mechanism: Secondary | ICD-10-CM | POA: Diagnosis not present

## 2021-03-24 DIAGNOSIS — Q676 Pectus excavatum: Secondary | ICD-10-CM | POA: Diagnosis not present

## 2021-03-25 DIAGNOSIS — Q676 Pectus excavatum: Secondary | ICD-10-CM | POA: Diagnosis not present

## 2021-03-25 DIAGNOSIS — Z79899 Other long term (current) drug therapy: Secondary | ICD-10-CM | POA: Diagnosis not present

## 2021-03-25 DIAGNOSIS — F419 Anxiety disorder, unspecified: Secondary | ICD-10-CM | POA: Diagnosis not present

## 2021-03-25 DIAGNOSIS — Z472 Encounter for removal of internal fixation device: Secondary | ICD-10-CM | POA: Diagnosis not present

## 2021-03-25 DIAGNOSIS — Z791 Long term (current) use of non-steroidal anti-inflammatories (NSAID): Secondary | ICD-10-CM | POA: Diagnosis not present

## 2021-03-25 DIAGNOSIS — D509 Iron deficiency anemia, unspecified: Secondary | ICD-10-CM | POA: Diagnosis not present

## 2021-03-25 DIAGNOSIS — Z9889 Other specified postprocedural states: Secondary | ICD-10-CM | POA: Diagnosis not present

## 2021-03-25 DIAGNOSIS — F32A Depression, unspecified: Secondary | ICD-10-CM | POA: Diagnosis not present

## 2021-03-25 DIAGNOSIS — Z87768 Personal history of other specified (corrected) congenital malformations of integument, limbs and musculoskeletal system: Secondary | ICD-10-CM | POA: Diagnosis not present

## 2021-03-25 DIAGNOSIS — Z8616 Personal history of COVID-19: Secondary | ICD-10-CM | POA: Diagnosis not present

## 2021-03-31 DIAGNOSIS — Z832 Family history of diseases of the blood and blood-forming organs and certain disorders involving the immune mechanism: Secondary | ICD-10-CM | POA: Diagnosis not present

## 2021-03-31 DIAGNOSIS — D649 Anemia, unspecified: Secondary | ICD-10-CM | POA: Diagnosis not present

## 2021-03-31 DIAGNOSIS — R790 Abnormal level of blood mineral: Secondary | ICD-10-CM | POA: Diagnosis not present

## 2021-03-31 DIAGNOSIS — F419 Anxiety disorder, unspecified: Secondary | ICD-10-CM | POA: Diagnosis not present

## 2021-04-13 ENCOUNTER — Telehealth: Payer: Self-pay | Admitting: Oncology

## 2021-04-13 NOTE — Telephone Encounter (Signed)
Scheduled appt per 12/5 referral. Pt's mother is aware of appt date and time.

## 2021-04-26 NOTE — Progress Notes (Signed)
[facility]  [address] [place],  [place]  [address] [phone]  Clinic Day:  04/27/2021  Referring physician:  Graylon Gunning PA   HISTORY OF PRESENT ILLNESS:  The patient is a 25 y.o. male who I was asked to consult upon for a high likelihood of having thalassemia.  Recent labs showed a normal hemoglobin of 14.3, but with a low MCV of 74.  His red blood count was normal at 6.09 million.  Previous iron studies have all been normal.  This combination of labs raised suspicion for thalassemia, which has him in today for further evaluation.  According to the patient's father, he has similar blood issues, but has never been formally worked up for having thalassemia.  The patient denies ever having increased fatigue or other symptoms which have alerted him to ever having severe thalassemia or significant anemia.  He recently underwent pectus excavatum surgery.  His presurgical labs showed the aforementioned hematologic abnormalities.  PAST MEDICAL HISTORY:   Past Medical History:  Diagnosis Date   Allergic rhinitis    Asthma    as a child   Family history of adverse reaction to anesthesia    Moather - extreme nausea   Headache    migraines- years ago- very rare now    PAST SURGICAL HISTORY:   Past Surgical History:  Procedure Laterality Date   none     PECTUS EXCAVATUM REPAIR N/A 11/25/2015   Procedure: MODIFIED RAVITCH REPAIR OF PECTUS EXCAVATUM WITH STERNAL PLATING;  Surgeon: Delight Ovens, MD;  Location: [facility] OR;  Service: Thoracic;  Laterality: N/A;    CURRENT MEDICATIONS:   Current Outpatient Medications  Medication Sig Dispense Refill   hydrOXYzine (ATARAX) 25 MG tablet Take by mouth.     acetaminophen (TYLENOL) 500 MG tablet Take by mouth.     escitalopram (LEXAPRO) 20 MG tablet Take 20 mg by mouth daily.     ibuprofen (ADVIL,MOTRIN) 200 MG tablet Take 200 mg by mouth every 6 (six) hours as needed.     No current  facility-administered medications for this visit.    ALLERGIES:  No Known Allergies  FAMILY HISTORY:   Family History  Problem Relation Age of Onset   Seizures Maternal Grandmother    Breast cancer Maternal Grandmother     SOCIAL HISTORY:  The patient was born and raised in [place]  [place]  He is not married and has no children.  He is currently a Archivist.  He denies a history of tobacco use.  He uses alcohol on rare occasions.  REVIEW OF SYSTEMS:  Review of Systems  Constitutional:  Negative for fatigue, fever and unexpected weight change.  Respiratory:  Negative for chest tightness, cough, hemoptysis and shortness of breath.   Cardiovascular:  Negative for chest pain and palpitations.  Gastrointestinal:  Negative for abdominal distention, abdominal pain, blood in stool, constipation, diarrhea, nausea and vomiting.  Genitourinary:  Negative for dysuria, frequency and hematuria.   Musculoskeletal:  Negative for arthralgias, back pain and myalgias.  Skin:  Negative for itching and rash.  Neurological:  Positive for headaches. Negative for dizziness and light-headedness.  Psychiatric/Behavioral:  Negative for depression and suicidal ideas.     PHYSICAL EXAM:  Blood pressure 105/61, pulse 79, temperature 98.5 F (36.9 C), resp. rate 16, height 5' 10.5" (1.791 m), weight 123 lb 4.8 oz (55.9 kg), SpO2 99 %. Wt Readings from Last 3 Encounters:  04/27/21 123 lb 4.8  oz (55.9 kg)  07/16/20 120 lb (54.4 kg)  02/22/18 122 lb (55.3 kg)   Body mass index is 17.44 kg/m. Performance status (ECOG): 0 - Asymptomatic Physical Exam Constitutional:      Appearance: Normal appearance. He is not ill-appearing.  HENT:     Mouth/Throat:     Mouth: Mucous membranes are moist.     Pharynx: Oropharynx is clear. No oropharyngeal exudate or posterior oropharyngeal erythema.  Cardiovascular:     Rate and Rhythm: Normal rate and regular rhythm.     Heart sounds: No  murmur heard.   No friction rub. No gallop.  Pulmonary:     Effort: Pulmonary effort is normal. No respiratory distress.     Breath sounds: Normal breath sounds. No wheezing, rhonchi or rales.  Abdominal:     General: Bowel sounds are normal. There is no distension.     Palpations: Abdomen is soft. There is no mass.     Tenderness: There is no abdominal tenderness.  Musculoskeletal:        General: No swelling.     Right lower leg: No edema.     Left lower leg: No edema.  Lymphadenopathy:     Cervical: No cervical adenopathy.     Upper Body:     Right upper body: No supraclavicular or axillary adenopathy.     Left upper body: No supraclavicular or axillary adenopathy.     Lower Body: No right inguinal adenopathy. No left inguinal adenopathy.  Skin:    General: Skin is warm.     Coloration: Skin is not jaundiced.     Findings: No lesion or rash.  Neurological:     General: No focal deficit present.     Mental Status: He is alert and oriented to person, place, and time. Mental status is at baseline.  Psychiatric:        Mood and Affect: Mood normal.        Behavior: Behavior normal.        Thought Content: Thought content normal.   LABS:    ASSESSMENT & PLAN:  A 25 y.o. male who I was asked to consult upon for the high likelihood of having thalassemia.  In clinic today, I reviewed his CBC with him today.  The combination of having a normal hemoglobin, low MCV, and an elevated red blood count is usually consistent with having thalassemia.  I will screen him for both alpha and beta thalassemia today.  Irrespective of what these labs show, the patient understands that he likely would not have any problems as it appears his baseline hemoglobin is 14.  If he has alpha thalassemia, he likely has only 1 alpha subunit missing.  If he has beta thalassemia, it is likely that only a slightly suboptimal production of beta subunits is present.  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  There is a high likelihood he will pass this on to any future children.  With his baseline hemoglobin being 14, this gentleman likely never have problems from his underlying thalassemia.  I will notify him within the next few weeks as to whether his labs show him to have either alpha or beta thalassemia.  Otherwise, as there are no other pressing hematologic issues, I do feel comfortable turning his care back over to his primary care office.  The patient understands all the plans discussed today and is in agreement with them.  I do  appreciate Ihor Dow, PA, for his new consult.   Kaiana Marion Macarthur Critchley, MD

## 2021-04-27 ENCOUNTER — Other Ambulatory Visit: Payer: Self-pay | Admitting: Oncology

## 2021-04-27 ENCOUNTER — Inpatient Hospital Stay: Payer: BC Managed Care – PPO

## 2021-04-27 ENCOUNTER — Inpatient Hospital Stay: Payer: BC Managed Care – PPO | Attending: Oncology | Admitting: Oncology

## 2021-04-27 ENCOUNTER — Other Ambulatory Visit: Payer: Self-pay | Admitting: Hematology and Oncology

## 2021-04-27 DIAGNOSIS — Z803 Family history of malignant neoplasm of breast: Secondary | ICD-10-CM | POA: Diagnosis not present

## 2021-04-27 DIAGNOSIS — R718 Other abnormality of red blood cells: Secondary | ICD-10-CM | POA: Diagnosis not present

## 2021-04-27 DIAGNOSIS — D569 Thalassemia, unspecified: Secondary | ICD-10-CM

## 2021-04-27 DIAGNOSIS — Z79899 Other long term (current) drug therapy: Secondary | ICD-10-CM | POA: Diagnosis not present

## 2021-04-27 LAB — CBC AND DIFFERENTIAL
HCT: 45 (ref 41–53)
Hemoglobin: 14.2 (ref 13.5–17.5)
Neutrophils Absolute: 2.54
Platelets: 136 — AB (ref 150–399)
WBC: 4.8

## 2021-04-27 LAB — CBC
MCV: 73 — AB (ref 80–94)
RBC: 6.2 — AB (ref 3.87–5.11)

## 2021-04-28 ENCOUNTER — Telehealth: Payer: Self-pay | Admitting: Oncology

## 2021-04-28 LAB — HGB FRACTIONATION CASCADE

## 2021-04-28 NOTE — Telephone Encounter (Signed)
No LOS Entered 

## 2021-05-05 LAB — ALPHA-THALASSEMIA GENOTYPR

## 2021-10-01 DIAGNOSIS — D569 Thalassemia, unspecified: Secondary | ICD-10-CM | POA: Diagnosis not present

## 2021-10-01 DIAGNOSIS — Z0000 Encounter for general adult medical examination without abnormal findings: Secondary | ICD-10-CM | POA: Diagnosis not present

## 2021-10-01 DIAGNOSIS — Z681 Body mass index (BMI) 19 or less, adult: Secondary | ICD-10-CM | POA: Diagnosis not present
